# Patient Record
Sex: Female | Born: 2000 | Race: Black or African American | Hispanic: No | Marital: Single | Smoking: Never smoker
Health system: Southern US, Community
[De-identification: ages and names within clinical notes are randomized; demographics above are authoritative.]

## PROBLEM LIST (undated history)

## (undated) DIAGNOSIS — L259 Unspecified contact dermatitis, unspecified cause: Secondary | ICD-10-CM

## (undated) DIAGNOSIS — J45909 Unspecified asthma, uncomplicated: Secondary | ICD-10-CM

## (undated) DIAGNOSIS — D649 Anemia, unspecified: Secondary | ICD-10-CM

## (undated) HISTORY — DX: Unspecified contact dermatitis, unspecified cause: L25.9

## (undated) HISTORY — DX: Anemia, unspecified: D64.9

---
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
## 2021-10-28 DIAGNOSIS — B9689 Other specified bacterial agents as the cause of diseases classified elsewhere: Secondary | ICD-10-CM | POA: Diagnosis not present

## 2021-10-28 DIAGNOSIS — N898 Other specified noninflammatory disorders of vagina: Secondary | ICD-10-CM | POA: Diagnosis not present

## 2021-12-31 DIAGNOSIS — N898 Other specified noninflammatory disorders of vagina: Secondary | ICD-10-CM | POA: Diagnosis not present

## 2021-12-31 DIAGNOSIS — Z3202 Encounter for pregnancy test, result negative: Secondary | ICD-10-CM | POA: Diagnosis not present

## 2022-01-18 DIAGNOSIS — N898 Other specified noninflammatory disorders of vagina: Secondary | ICD-10-CM | POA: Diagnosis not present

## 2022-01-18 DIAGNOSIS — N760 Acute vaginitis: Secondary | ICD-10-CM | POA: Diagnosis not present

## 2022-01-18 DIAGNOSIS — B9689 Other specified bacterial agents as the cause of diseases classified elsewhere: Secondary | ICD-10-CM | POA: Diagnosis not present

## 2022-11-18 ENCOUNTER — Emergency Department (HOSPITAL_BASED_OUTPATIENT_CLINIC_OR_DEPARTMENT_OTHER)
Admission: EM | Admit: 2022-11-18 | Discharge: 2022-11-18 | Disposition: A | Discharge status: Home / Self Care | Payer: BC Managed Care – PPO | Attending: Emergency Medicine | Admitting: Emergency Medicine

## 2022-11-18 ENCOUNTER — Other Ambulatory Visit (HOSPITAL_BASED_OUTPATIENT_CLINIC_OR_DEPARTMENT_OTHER): Payer: Self-pay

## 2022-11-18 ENCOUNTER — Encounter (HOSPITAL_BASED_OUTPATIENT_CLINIC_OR_DEPARTMENT_OTHER): Payer: Self-pay | Admitting: *Deleted

## 2022-11-18 ENCOUNTER — Other Ambulatory Visit: Payer: Self-pay

## 2022-11-18 ENCOUNTER — Emergency Department (HOSPITAL_BASED_OUTPATIENT_CLINIC_OR_DEPARTMENT_OTHER): Payer: BC Managed Care – PPO | Admitting: Radiology

## 2022-11-18 DIAGNOSIS — E876 Hypokalemia: Secondary | ICD-10-CM | POA: Insufficient documentation

## 2022-11-18 DIAGNOSIS — T8859XA Other complications of anesthesia, initial encounter: Secondary | ICD-10-CM | POA: Insufficient documentation

## 2022-11-18 DIAGNOSIS — R002 Palpitations: Secondary | ICD-10-CM | POA: Diagnosis not present

## 2022-11-18 LAB — CBC
HCT: 36 % (ref 36.0–46.0)
Hemoglobin: 12.2 g/dL (ref 12.0–15.0)
MCH: 30.1 pg (ref 26.0–34.0)
MCHC: 33.9 g/dL (ref 30.0–36.0)
MCV: 88.9 fL (ref 80.0–100.0)
Platelets: 307 10*3/uL (ref 150–400)
RBC: 4.05 MIL/uL (ref 3.87–5.11)
RDW: 13.3 % (ref 11.5–15.5)
WBC: 11.1 10*3/uL — ABNORMAL HIGH (ref 4.0–10.5)
nRBC: 0 % (ref 0.0–0.2)

## 2022-11-18 LAB — BASIC METABOLIC PANEL
Anion gap: 9 (ref 5–15)
BUN: 11 mg/dL (ref 6–20)
CO2: 26 mmol/L (ref 22–32)
Calcium: 9 mg/dL (ref 8.9–10.3)
Chloride: 106 mmol/L (ref 98–111)
Creatinine, Ser: 0.76 mg/dL (ref 0.44–1.00)
GFR, Estimated: >60 mL/min (ref >60)
Glucose, Bld: 83 mg/dL (ref 70–99)
Potassium: 3 mmol/L — ABNORMAL LOW (ref 3.5–5.1)
Sodium: 141 mmol/L (ref 135–145)

## 2022-11-18 LAB — PREGNANCY, URINE: Preg Test, Ur: NEGATIVE

## 2022-11-18 LAB — TROPONIN I (HIGH SENSITIVITY)
Troponin I (High Sensitivity): 3 ng/L (ref <18)
Troponin I (High Sensitivity): 3 ng/L (ref <18)

## 2022-11-18 MED ORDER — POTASSIUM CHLORIDE CRYS ER 20 MEQ PO TBCR
40.0000 meq | EXTENDED_RELEASE_TABLET | Freq: Once | ORAL | Status: AC
  Administered 2022-11-18: 40 meq via ORAL
  Filled 2022-11-18: qty 2

## 2022-11-18 NOTE — ED Provider Notes (Signed)
[facility] AT [facility] Provider Note   CSN: [id number] Arrival date & time: 11/18/22  7253     History  Chief Complaint  Patient presents with   Palpitations    [name] is a 22 y.o. female.  HPI 22 year old female sent to the ED from oral surgery office.  They report that they were doing conscious sedation for extraction of wisdom teeth.  She was given fentanyl, midazolam, and triazolam.  Per their notes during procedure, an irregular pulse was recorded which was concerning..  Per the patient's mother they read first the medications and sent her to the emergency department.  They sent a several vital signs from the monitor.  I have highlighted a low heart rate of 45 and 0.  Simultaneously, they recorded pulse rate present, presumably from the monitor, of 74 and 73.  Review of vital signs show that patient's pulse rate remained in the 70s to 80s throughout the recording of the procedure.  Her blood pressures remained systolically 1 36-1 41 throughout the procedure.  Her oxygen saturations were recorded as 100% with respiratory rate 6, 7, and 8.  Patient is awake and alert and has no complaints or memory of what happened.    Home Medications Prior to Admission medications   Medication Sig Start Date End Date Taking? Authorizing Provider  albuterol (PROAIR HFA) 108 (90 BASE) MCG/ACT inhaler Inhale 2 puffs into the lungs 4 (four) times daily.      [provider]  ferrous sulfate 325 (65 FE) MG tablet Take 325 mg by mouth daily with breakfast.      [provider]  fluticasone (FLOVENT HFA) 44 MCG/ACT inhaler Inhale 2 puffs into the lungs 2 (two) times daily.      [provider]  Spacer/Aero-Holding Chambers (AEROCHAMBER MAX W/MASK SMALL) inhaler by Other route. Use as instructed     [provider]  triamcinolone (KENALOG) 0.1 % ointment Apply topically 2 (two) times daily.     [provider]   triamcinolone cream (KENALOG) 0.1 % APPLY TO CONTACT ECZEMA TWO TIMES A DAY UNTIL RESOLVED 05/14/12   Panosh, Neta Mends, MD      Allergies    Montelukast sodium    Review of Systems   Review of Systems  Physical Exam Updated Vital Signs BP 120/74   Pulse 61   Temp 98.5 F (36.9 C) (Oral)   Resp 18   SpO2 100%  Physical Exam Vitals and nursing note reviewed.  Constitutional:      Appearance: Normal appearance.  HENT:     Head: Normocephalic.     Right Ear: External ear normal.     Left Ear: External ear normal.     Nose: Nose normal.     Mouth/Throat:     Pharynx: Oropharynx is clear.  Eyes:     Extraocular Movements: Extraocular movements intact.     Pupils: Pupils are equal, round, and reactive to light.  Cardiovascular:     Rate and Rhythm: Normal rate and regular rhythm.     Pulses: Normal pulses.     Heart sounds: Normal heart sounds.  Pulmonary:     Effort: Pulmonary effort is normal.     Breath sounds: Normal breath sounds.  Abdominal:     General: Abdomen is flat.     Palpations: Abdomen is soft.  Musculoskeletal:        General: Normal range of motion.     Cervical back: Normal range  of motion.  Skin:    General: Skin is warm and dry.     Capillary Refill: Capillary refill takes less than 2 seconds.  Neurological:     General: No focal deficit present.     Mental Status: She is alert.  Psychiatric:        Mood and Affect: Mood normal.     ED Results / Procedures / Treatments   Labs (all labs ordered are listed, but only abnormal results are displayed) Labs Reviewed  BASIC METABOLIC PANEL - Abnormal; Notable for the following components:      Result Value   Potassium 3.0 (*)    All other components within normal limits  CBC - Abnormal; Notable for the following components:   WBC 11.1 (*)    All other components within normal limits  PREGNANCY, URINE  TROPONIN I (HIGH SENSITIVITY)  TROPONIN I (HIGH SENSITIVITY)    EKG EKG  Interpretation  Date/Time:  Thursday November 18 2022 11:06:37 EDT Ventricular Rate:  73 PR Interval:  138 QRS Duration: 80 QT Interval:  388 QTC Calculation: 427 R Axis:   91 Text Interpretation: Normal sinus rhythm with sinus arrhythmia No significant change since last tracing Normal ECG Confirmed by Margarita Grizzle [phone]) on 11/18/2022 2:42:33 PM  Radiology DG Chest 2 View  Result Date: 11/18/2022 CLINICAL DATA:  Palpitations, irregular heart rate observed in dentist's office EXAM: CHEST - 2 VIEW COMPARISON:  09/15/2006 FINDINGS: The heart size and mediastinal contours are within normal limits. Both lungs are clear. The visualized skeletal structures are unremarkable. IMPRESSION: No active cardiopulmonary disease. Electronically Signed   By: Ernie Avena M.D.   On: 11/18/2022 12:09    Procedures Procedures    Medications Ordered in ED Medications  potassium chloride SA (KLOR-CON M) CR tablet 40 mEq (40 mEq Oral Given 11/18/22 1344)    ED Course/ Medical Decision Making/ A&P Clinical Course as of 11/18/22 1445  Thu Nov 18, 2022  1440 Metabolic panel reviewed and interpreted significant for hypokalemia potassium 3.0 [DR]  1440 CBC reviewed interpreted significant for mild leukocytosis with white count of 11,100 otherwise within normal limits Urine pregnancy test reviewed interpreted within normal limits [DR]    Clinical Course User Index [DR] Margarita Grizzle, MD                             Medical Decision Making Amount and/or Complexity of Data Reviewed Labs: ordered. Radiology: ordered.  Risk Prescription drug management.   22 year old female seen in the ED after referral from dentist office.  Patient was sedated in the dentist office with fentanyl and 2 types of benzodiazepines.  They noted high normal blood pressures and normal oxygen saturations with a heart rate of 0 but pulse rate of 74.  She was sent to the ED for further evaluation.  She is awake and alert  without complaints. Here in the ED she is evaluated with labs, EKG. She is noted to be hypokalemic and received p.o. potassium here in ED. Discussed findings of hypokalemia and oral repletion need for recheck Patient has been stable here in the ED with ongoing good pulses, normalized blood pressure, normal respiratory rate and good oxygen saturations. She is advised to return if any problems. She is advised to follow-up with her dentist regarding further management of her wisdom teeth        Final Clinical Impression(s) / ED Diagnoses Final diagnoses:  Hypokalemia  Complication of  anesthesia, initial encounter    Rx / DC Orders ED Discharge Orders     None         Margarita Grizzle, MD 11/18/22 1445

## 2022-11-18 NOTE — Discharge Instructions (Signed)
You were found to have mild hypokalemia here in the emergency department This may be effects of the anesthesia.  You were given 1 dose of potassium here in the ED.  Please eat potassium rich foods and have your potassium rechecked in the next week No other abnormalities were noted with normal EKG and labs otherwise normal. Please follow-up with your dentist regarding further dental procedures.

## 2022-11-18 NOTE — ED Triage Notes (Signed)
Pt was at dentist and was going to get her wisdom teeth out and she reports that she was told her HR was irregular and she was told to come here.  No CP or sob.  Pt is in no distress

## 2022-12-14 DIAGNOSIS — D1801 Hemangioma of skin and subcutaneous tissue: Secondary | ICD-10-CM | POA: Diagnosis not present

## 2022-12-14 DIAGNOSIS — Z91048 Other nonmedicinal substance allergy status: Secondary | ICD-10-CM | POA: Diagnosis not present

## 2022-12-14 DIAGNOSIS — L249 Irritant contact dermatitis, unspecified cause: Secondary | ICD-10-CM | POA: Diagnosis not present

## 2022-12-14 DIAGNOSIS — L2089 Other atopic dermatitis: Secondary | ICD-10-CM | POA: Diagnosis not present

## 2022-12-14 DIAGNOSIS — D485 Neoplasm of uncertain behavior of skin: Secondary | ICD-10-CM | POA: Diagnosis not present

## 2022-12-23 DIAGNOSIS — Z6824 Body mass index (BMI) 24.0-24.9, adult: Secondary | ICD-10-CM | POA: Diagnosis not present

## 2022-12-23 DIAGNOSIS — J209 Acute bronchitis, unspecified: Secondary | ICD-10-CM | POA: Diagnosis not present

## 2022-12-27 DIAGNOSIS — N760 Acute vaginitis: Secondary | ICD-10-CM | POA: Diagnosis not present

## 2022-12-27 DIAGNOSIS — B9689 Other specified bacterial agents as the cause of diseases classified elsewhere: Secondary | ICD-10-CM | POA: Diagnosis not present

## 2022-12-27 DIAGNOSIS — N898 Other specified noninflammatory disorders of vagina: Secondary | ICD-10-CM | POA: Diagnosis not present

## 2023-02-14 DIAGNOSIS — E786 Lipoprotein deficiency: Secondary | ICD-10-CM | POA: Diagnosis not present

## 2023-02-14 DIAGNOSIS — E876 Hypokalemia: Secondary | ICD-10-CM | POA: Diagnosis not present

## 2023-02-14 DIAGNOSIS — I499 Cardiac arrhythmia, unspecified: Secondary | ICD-10-CM | POA: Diagnosis not present

## 2023-02-14 DIAGNOSIS — D649 Anemia, unspecified: Secondary | ICD-10-CM | POA: Diagnosis not present

## 2023-03-04 ENCOUNTER — Encounter (HOSPITAL_BASED_OUTPATIENT_CLINIC_OR_DEPARTMENT_OTHER): Payer: Self-pay

## 2023-04-11 DIAGNOSIS — E876 Hypokalemia: Secondary | ICD-10-CM | POA: Diagnosis not present

## 2023-04-11 DIAGNOSIS — I499 Cardiac arrhythmia, unspecified: Secondary | ICD-10-CM | POA: Diagnosis not present

## 2023-04-11 DIAGNOSIS — D649 Anemia, unspecified: Secondary | ICD-10-CM | POA: Diagnosis not present

## 2023-04-14 DIAGNOSIS — R519 Headache, unspecified: Secondary | ICD-10-CM | POA: Diagnosis not present

## 2023-04-14 DIAGNOSIS — M545 Low back pain, unspecified: Secondary | ICD-10-CM | POA: Diagnosis not present

## 2023-04-14 DIAGNOSIS — M62830 Muscle spasm of back: Secondary | ICD-10-CM | POA: Diagnosis not present

## 2023-04-14 DIAGNOSIS — M79641 Pain in right hand: Secondary | ICD-10-CM | POA: Diagnosis not present

## 2023-05-12 ENCOUNTER — Encounter (HOSPITAL_BASED_OUTPATIENT_CLINIC_OR_DEPARTMENT_OTHER): Payer: Self-pay | Admitting: Cardiology

## 2023-05-12 ENCOUNTER — Ambulatory Visit (HOSPITAL_BASED_OUTPATIENT_CLINIC_OR_DEPARTMENT_OTHER): Payer: BC Managed Care – PPO | Admitting: Cardiology

## 2023-05-12 VITALS — BP 100/68 | HR 69 | Ht 63.0 in | Wt 128.6 lb

## 2023-05-12 DIAGNOSIS — Z01810 Encounter for preprocedural cardiovascular examination: Secondary | ICD-10-CM

## 2023-05-12 DIAGNOSIS — Z716 Tobacco abuse counseling: Secondary | ICD-10-CM | POA: Diagnosis not present

## 2023-05-12 DIAGNOSIS — Z7189 Other specified counseling: Secondary | ICD-10-CM

## 2023-05-12 DIAGNOSIS — I499 Cardiac arrhythmia, unspecified: Secondary | ICD-10-CM | POA: Diagnosis not present

## 2023-05-12 NOTE — Progress Notes (Signed)
Cardiology Office Note:  .    Date:  05/12/2023  ID:  Donivan Scull, DOB [birth year], MRN [id number] PCP: Madelin Headings, MD  [facility] Providers Cardiologist:  Jodelle Red, MD     History of Present Illness: [name] is a 22 y.o. female with a hx of hypokalemia, anemia, asthma, here for the evaluation of cardiac arrhythmia.  Per referral notes from 02/14/23 (see media tab), patient received triazolam, midazolam, and fentanyl for wisdom teeth extraction on 11/18/2022. Noted to have bradycardia and irregular rhythm raging 32-132 bpm. Procedure was stopped, patient was transported to ER. Potassium was 3, ECG noted sinus arrhythmia. She was given oral potassium in the ER and instructed to follow up with her PCP. Her provider then referred her to cardiology for further evaluation.  Per ER note from 11/18/22, "They sent a several vital signs from the monitor. I have highlighted a low heart rate of 45 and 0. Simultaneously, they recorded pulse rate present, presumably from the monitor, of 74 and 73. Review of vital signs show that patient's pulse rate remained in the 70s to 80s throughout the recording of the procedure. Her blood pressures remained systolically 1 36-1 41 throughout the procedure. Her oxygen saturations were recorded as 100% with respiratory rate 6, 7, and 8. Patient is awake and alert and has no complaints or memory of what happened... Patient was sedated in the dentist office with fentanyl and 2 types of benzodiazepines.  They noted high normal blood pressures and normal oxygen saturations with a heart rate of 0 but pulse rate of 74.  She was sent to the ED for further evaluation.  She is awake and alert without complaints. Here in the ED she is evaluated with labs, EKG. She is noted to be hypokalemic and received p.o. potassium here in ED. Discussed findings of hypokalemia and oral repletion need for recheck Patient has been stable here in the ED with  ongoing good pulses, normalized blood pressure, normal respiratory rate and good oxygen saturations."  Today, she is accompanied by her mother. She confirms never experiencing prior issues with arrhythmias before her event as noted above. She will have lighter sedation when she her wisdom teeth extraction is rescheduled.  In 2014 she had a fainting spell, thought to be related to dehydration. No recurring episodes of near syncope or syncope.  Cardiovascular risk factors: Prior clinical ASCVD: None. Comorbid conditions:  None. In the office her blood pressure is 100/68. Metabolic syndrome/Obesity:  Current weight 128 lbs. Chronic inflammatory conditions: None. Tobacco use history: She currently smokes. Alcohol use:  Occasional alcohol consumption. Family history: Her uncle had a heart attack. Her aunt was on medications for hypertension. Her mother had a heart murmur during pregnancy. Her cousin had a stroke and subsequently underwent PFO closure when she was 28-29 yo. Prior pertinent testing and/or incidental findings: Chest x-ray  Exercise level: She denies any physical limitations. Current diet: Enjoys junk food.  She denies any chest pain, shortness of breath, peripheral edema, lightheadedness, headaches, orthopnea, or PND.  ROS:  Please see the history of present illness. ROS otherwise negative except as noted.   Studies Reviewed: Marland Kitchen         Chest X-Ray  11/18/2022: FINDINGS: The heart size and mediastinal contours are within normal limits. Both lungs are clear. The visualized skeletal structures are unremarkable.   IMPRESSION: No active cardiopulmonary disease.  Physical Exam:    VS:  BP 100/68  Pulse 69   Ht 5\' 3"  (1.6 m)   Wt 128 lb 9.6 oz (58.3 kg)   SpO2 99%   BMI 22.78 kg/m    Wt Readings from Last 3 Encounters:  05/12/23 128 lb 9.6 oz (58.3 kg)  04/16/13 134 lb (60.8 kg) (81%, Z= 0.89)*  07/17/12 131 lb (59.4 kg) (83%, Z= 0.97)*   * Growth percentiles are  based on CDC (Girls, 2-20 Years) data.    GEN: Well nourished, well developed in no acute distress HEENT: Normal, moist mucous membranes NECK: No JVD CARDIAC: regular rhythm, normal S1 and S2, no rubs or gallops. No murmur. VASCULAR: Radial and DP pulses 2+ bilaterally. No carotid bruits RESPIRATORY:  Clear to auscultation without rales, wheezing or rhonchi  ABDOMEN: Soft, non-tender, non-distended MUSCULOSKELETAL:  Ambulates independently SKIN: Warm and dry, no edema NEUROLOGIC:  Alert and oriented x 3. No focal neuro deficits noted. PSYCHIATRIC:  Normal affect   ASSESSMENT AND PLAN: .    Arrhythmia with IV sedation Preoperative cardiovascular evaluation -she has no history of cardiac disease, has never had an issue before, and she was monitored after the event in the ER without significant abnormalities beyond hypokalemia -this supports that she had a response to the medications used for sedation -no indication for further cardiac testing -recommend that she be monitored with conscious sedation, perhaps with lower doses/less medication overall, defer to practitioner on their preferences  CV risk counseling and prevention -recommend heart healthy/Mediterranean diet, with whole grains, fruits, vegetable, fish, lean meats, nuts, and olive oil. Limit salt. -recommend moderate walking, 3-5 times/week for 30-50 minutes each session. Aim for at least 150 minutes.week. Goal should be pace of 3 miles/hours, or walking 1.5 miles in 30 minutes -recommend avoidance of tobacco products. Avoid excess alcohol. -ASCVD risk score: The ASCVD Risk score (Arnett DK, et al., 2019) failed to calculate for the following reasons:   The 2019 ASCVD risk score is only valid for ages 13 to 19    Dispo: Follow-up as needed.  I,Mathew Stumpf,acting as a Neurosurgeon for [employer], MD.,have documented all relevant documentation on the behalf of Jodelle Red, MD,as directed by  Jodelle Red, MD while in the presence of Jodelle Red, MD.  I, Jodelle Red, MD, have reviewed all documentation for this visit. The documentation on 05/12/23 for the exam, diagnosis, procedures, and orders are all accurate and complete.   Signed, Jodelle Red, MD

## 2023-05-12 NOTE — Patient Instructions (Signed)
Medication Instructions:  .Your physician recommends that you continue on your current medications as directed. Please refer to the Current Medication list given to you today.  Labwork: none  Testing/Procedures: none  Follow-Up: As needed

## 2023-05-30 DIAGNOSIS — I499 Cardiac arrhythmia, unspecified: Secondary | ICD-10-CM | POA: Diagnosis not present

## 2023-05-30 DIAGNOSIS — E876 Hypokalemia: Secondary | ICD-10-CM | POA: Diagnosis not present

## 2023-07-05 ENCOUNTER — Telehealth (HOSPITAL_BASED_OUTPATIENT_CLINIC_OR_DEPARTMENT_OTHER): Payer: Self-pay | Admitting: *Deleted

## 2023-07-05 NOTE — Telephone Encounter (Signed)
   Pre-operative Risk Assessment    Patient Name: [name]  DOB: 06-Apr-2001 MRN: [id number]     Request for Surgical Clearance    Procedure:  Dental Extraction - Amount of Teeth to be Pulled:  5  Date of Surgery:  Clearance TBD                                 Surgeon: [facility]  Surgeon's Group or Practice Name:    [facility]iff[facility] Dakin, DMD  Phone number:  [phone] Fax number:  [phone]   Type of Clearance Requested:   - Medical    Type of Anesthesia:   IV Sedation, per fax has history of Asthma response to sedation medication  Medications used during IV sedation : Fentanyl, Midazolam, Ketorolac tromethamine injection, Dexamethasone sodium phosphate, and Triazolam    Additional requests/questions:    Candace Gallus   07/05/2023, 7:09 PM

## 2023-07-06 NOTE — Telephone Encounter (Signed)
   Patient Name: [name]  DOB: 31-Oct-2000 MRN: [id number]  Primary Cardiologist: Jodelle Red, MD  Chart reviewed as part of pre-operative protocol coverage. Given past medical history and time since last visit, based on ACC/AHA guidelines, [name] is at acceptable risk for the planned procedure without further cardiovascular testing.   Please see patient's visit follow-up note enclosed in this fax for specific guidelines regarding conscious sedation.  The patient was advised that if she develops new symptoms prior to surgery to contact our office to arrange for a follow-up visit, and she verbalized understanding.  I will route this recommendation to the requesting party via Epic fax function and remove from pre-op pool.  Please call with questions.  Napoleon Form, Leodis Rains, NP 07/06/2023, 8:00 AM

## 2024-05-08 DIAGNOSIS — L245 Irritant contact dermatitis due to other chemical products: Secondary | ICD-10-CM | POA: Diagnosis not present

## 2024-06-28 ENCOUNTER — Encounter: Payer: Self-pay | Admitting: *Deleted

## 2024-06-28 NOTE — Progress Notes (Signed)
 [name]                                          MRN: [id number]   06/28/2024   The [facility] Quality Team Specialist reviewed this patient medical record for the purposes of chart review for care gap closure. The following were reviewed: chart review for care gap closure-cervical cancer screening.    [facility] Quality Team
